# Patient Record
Sex: Male | Born: 1995
Health system: Southern US, Community
[De-identification: ages and names within clinical notes are randomized; demographics above are authoritative.]

## PROBLEM LIST (undated history)

## (undated) DIAGNOSIS — L732 Hidradenitis suppurativa: Secondary | ICD-10-CM

## (undated) DIAGNOSIS — J302 Other seasonal allergic rhinitis: Secondary | ICD-10-CM

## (undated) HISTORY — DX: Hidradenitis suppurativa: L73.2

---

## 2016-06-07 ENCOUNTER — Emergency Department (HOSPITAL_BASED_OUTPATIENT_CLINIC_OR_DEPARTMENT_OTHER): Payer: No Typology Code available for payment source

## 2016-06-07 ENCOUNTER — Emergency Department (HOSPITAL_BASED_OUTPATIENT_CLINIC_OR_DEPARTMENT_OTHER)
Admission: EM | Admit: 2016-06-07 | Discharge: 2016-06-07 | Disposition: A | Discharge status: Home / Self Care | Payer: No Typology Code available for payment source | Attending: Emergency Medicine | Admitting: Emergency Medicine

## 2016-06-07 ENCOUNTER — Encounter (HOSPITAL_BASED_OUTPATIENT_CLINIC_OR_DEPARTMENT_OTHER): Payer: Self-pay | Admitting: Emergency Medicine

## 2016-06-07 DIAGNOSIS — S199XXA Unspecified injury of neck, initial encounter: Secondary | ICD-10-CM | POA: Diagnosis present

## 2016-06-07 DIAGNOSIS — S161XXA Strain of muscle, fascia and tendon at neck level, initial encounter: Secondary | ICD-10-CM | POA: Diagnosis not present

## 2016-06-07 DIAGNOSIS — Y999 Unspecified external cause status: Secondary | ICD-10-CM | POA: Insufficient documentation

## 2016-06-07 DIAGNOSIS — Z79899 Other long term (current) drug therapy: Secondary | ICD-10-CM | POA: Insufficient documentation

## 2016-06-07 DIAGNOSIS — Y9389 Activity, other specified: Secondary | ICD-10-CM | POA: Diagnosis not present

## 2016-06-07 DIAGNOSIS — S39012A Strain of muscle, fascia and tendon of lower back, initial encounter: Secondary | ICD-10-CM

## 2016-06-07 DIAGNOSIS — Y92410 Unspecified street and highway as the place of occurrence of the external cause: Secondary | ICD-10-CM | POA: Diagnosis not present

## 2016-06-07 MED ORDER — NAPROXEN 500 MG PO TABS: 500.0000 mg | ORAL_TABLET | Freq: Two times a day (BID) | ORAL | 0 refills | Status: DC

## 2016-06-07 MED ORDER — IBUPROFEN 800 MG PO TABS
800.0000 mg | ORAL_TABLET | Freq: Once | ORAL | Status: AC
  Administered 2016-06-07: 800 mg via ORAL
  Filled 2016-06-07: qty 1

## 2016-06-07 MED ORDER — CYCLOBENZAPRINE HCL 10 MG PO TABS: 10.0000 mg | ORAL_TABLET | Freq: Two times a day (BID) | ORAL | 0 refills | Status: AC | PRN
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
## 2016-06-07 NOTE — Discharge Instructions (Signed)
Take naprosyn for pain. Flexeril for muscle spasms as needed. Rest. Follow up with your doctor if not improving in 3-5 days. Return if numbness, weakness, severe headache, vomiting, any new concerning symptom.
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
## 2016-11-18 ENCOUNTER — Emergency Department (HOSPITAL_BASED_OUTPATIENT_CLINIC_OR_DEPARTMENT_OTHER)
Admission: EM | Admit: 2016-11-18 | Discharge: 2016-11-18 | Disposition: A | Discharge status: Home / Self Care | Payer: Self-pay | Attending: Emergency Medicine | Admitting: Emergency Medicine

## 2016-11-18 ENCOUNTER — Encounter (HOSPITAL_BASED_OUTPATIENT_CLINIC_OR_DEPARTMENT_OTHER): Payer: Self-pay | Admitting: *Deleted

## 2016-11-18 DIAGNOSIS — R1110 Vomiting, unspecified: Secondary | ICD-10-CM

## 2016-11-18 DIAGNOSIS — R197 Diarrhea, unspecified: Secondary | ICD-10-CM | POA: Insufficient documentation

## 2016-11-18 DIAGNOSIS — R112 Nausea with vomiting, unspecified: Secondary | ICD-10-CM | POA: Insufficient documentation

## 2016-11-18 DIAGNOSIS — Z79899 Other long term (current) drug therapy: Secondary | ICD-10-CM | POA: Insufficient documentation

## 2016-11-18 HISTORY — DX: Other seasonal allergic rhinitis: J30.2

## 2016-11-18 LAB — CBC
HCT: 44.5 % (ref 39.0–52.0)
Hemoglobin: 15.2 g/dL (ref 13.0–17.0)
MCH: 30.8 pg (ref 26.0–34.0)
MCHC: 34.2 g/dL (ref 30.0–36.0)
MCV: 90.3 fL (ref 78.0–100.0)
PLATELETS: 267 10*3/uL (ref 150–400)
RBC: 4.93 MIL/uL (ref 4.22–5.81)
RDW: 12 % (ref 11.5–15.5)
WBC: 11.5 10*3/uL — AB (ref 4.0–10.5)

## 2016-11-18 LAB — COMPREHENSIVE METABOLIC PANEL
ALT: 23 U/L (ref 17–63)
ANION GAP: 8 (ref 5–15)
AST: 24 U/L (ref 15–41)
Albumin: 4.6 g/dL (ref 3.5–5.0)
Alkaline Phosphatase: 49 U/L (ref 38–126)
BUN: 15 mg/dL (ref 6–20)
CHLORIDE: 108 mmol/L (ref 101–111)
CO2: 24 mmol/L (ref 22–32)
Calcium: 9.3 mg/dL (ref 8.9–10.3)
Creatinine, Ser: 0.99 mg/dL (ref 0.61–1.24)
Glucose, Bld: 104 mg/dL — ABNORMAL HIGH (ref 65–99)
POTASSIUM: 3.6 mmol/L (ref 3.5–5.1)
SODIUM: 140 mmol/L (ref 135–145)
Total Bilirubin: 0.6 mg/dL (ref 0.3–1.2)
Total Protein: 8.2 g/dL — ABNORMAL HIGH (ref 6.5–8.1)

## 2016-11-18 LAB — URINALYSIS, ROUTINE W REFLEX MICROSCOPIC
Bilirubin Urine: NEGATIVE
GLUCOSE, UA: NEGATIVE mg/dL
Hgb urine dipstick: NEGATIVE
Ketones, ur: NEGATIVE mg/dL
LEUKOCYTES UA: NEGATIVE
Nitrite: NEGATIVE
PH: 7.5 (ref 5.0–8.0)
PROTEIN: NEGATIVE mg/dL
Specific Gravity, Urine: 1.026 (ref 1.005–1.030)

## 2016-11-18 LAB — LIPASE, BLOOD: LIPASE: 13 U/L (ref 11–51)

## 2016-11-18 MED ORDER — ONDANSETRON HCL 4 MG/2ML IJ SOLN
4.0000 mg | Freq: Once | INTRAMUSCULAR | Status: AC | PRN
  Administered 2016-11-18: 4 mg via INTRAVENOUS
  Filled 2016-11-18: qty 2

## 2016-11-18 MED ORDER — ONDANSETRON 4 MG PO TBDP: 4.0000 mg | ORAL_TABLET | Freq: Three times a day (TID) | ORAL | 0 refills | Status: DC | PRN

## 2016-11-18 MED FILL — ONDANSETRON ODT 4 MG TABLET: 4 | 6 days supply | Qty: 20 | Fill #0

## 2016-11-18 NOTE — ED Notes (Signed)
Pt given gingerale for fluid challenge. 

## 2016-11-18 NOTE — ED Triage Notes (Signed)
Pt reports sudden onset of vomiting and diarrhea at 3am, with abd cramping.

## 2016-11-18 NOTE — ED Provider Notes (Signed)
[facility]   CSN: [id number] Arrival date & time: 11/18/16  0908     History   Chief Complaint Chief Complaint  Patient presents with  . Emesis    HPI Jacob Melendez is a 21 y.o. male.  HPI  Pt presenting with c/o vomtiing and diarrhea.  Symptoms began acutely this morning at 3am.  He had multiple episodes of NB/NB emesis.  Has also developed watery diarrhea.  Has also been having some stomach cramping which is relieved by diarrhea temporarilty.  Pt has not been able to keep down liquids.  No fever/chills.  He does have some sick contacts at his work.  There are no other associated systemic symptoms, there are no other alleviating or modifying factors.   He has not had any treatment prior to arrival.    Past Medical History:  Diagnosis Date  . Seasonal allergies     There are no active problems to display for this patient.   History reviewed. No pertinent surgical history.     Home Medications    Prior to Admission medications   Medication Sig Start Date End Date Taking? Authorizing Provider  cyclobenzaprine (FLEXERIL) 10 MG tablet Take 1 tablet (10 mg total) by mouth 2 (two) times daily as needed for muscle spasms. 06/07/16   Tatyana Kirichenko, PA-C  montelukast (SINGULAIR) 10 MG tablet Take 10 mg by mouth at bedtime.    Historical Provider, MD  naproxen (NAPROSYN) 500 MG tablet Take 1 tablet (500 mg total) by mouth 2 (two) times daily. 06/07/16   Tatyana Kirichenko, PA-C  ondansetron (ZOFRAN ODT) 4 MG disintegrating tablet Take 1 tablet (4 mg total) by mouth every 8 (eight) hours as needed for nausea or vomiting. 11/18/16   Jerelyn Scott, MD    Family History History reviewed. No pertinent family history.  Social History Social History  Substance Use Topics  . Smoking status: Never Smoker  . Smokeless tobacco: Never Used  . Alcohol use No     Allergies   Patient has no known allergies.   Review of Systems Review of Systems  ROS  reviewed and all otherwise negative except for mentioned in HPI   Physical Exam Updated Vital Signs BP 131/69 (BP Location: Left Arm)   Pulse 99   Temp 99.2 F (37.3 C) (Oral)   Resp 20   Ht 6\' 2"  (1.88 m)   Wt 222 lb (100.7 kg)   SpO2 100%   BMI 28.50 kg/m  Vitals reviewed Physical Exam Physical Examination: General appearance - alert, well appearing, and in no distress Mental status - alert, oriented to person, place, and time Eyes -no conjunctival injection, no scleral icterus Mouth - mucous membranes moist, pharynx normal without lesions Chest - clear to auscultation, no wheezes, rales or rhonchi, symmetric air entry Heart - normal rate, regular rhythm, normal S1, S2, no murmurs, rubs, clicks or gallops Abdomen - soft, nontender, nondistended, no masses or organomegaly, nabs Neurological - alert, oriented, normal speech Extremities - peripheral pulses normal, no pedal edema, no clubbing or cyanosis Skin - normal coloration and turgor, no rashes  ED Treatments / Results  Labs (all labs ordered are listed, but only abnormal results are displayed) Labs Reviewed  COMPREHENSIVE METABOLIC PANEL - Abnormal; Notable for the following:       Result Value   Glucose, Bld 104 (*)    Total Protein 8.2 (*)    All other components within normal limits  CBC - Abnormal; Notable for the  following:    WBC 11.5 (*)    All other components within normal limits  LIPASE, BLOOD  URINALYSIS, ROUTINE W REFLEX MICROSCOPIC    EKG  EKG Interpretation None       Radiology No results found.  Procedures Procedures (including critical care time)  Medications Ordered in ED Medications  ondansetron (ZOFRAN) injection 4 mg (4 mg Intravenous Given 11/18/16 0946)     Initial Impression / Assessment and Plan / ED Course  I have reviewed the triage vital signs and the nursing notes.  Pertinent labs & imaging results that were available during my care of the patient were reviewed by me  and considered in my medical decision making (see chart for details).    12:05 PM pt tolerated po fluids in the ED.  He is feeling improved after treatment.   Pt presenting with acute onset of both vomiting and diarrhea.  Abdominal exam is benign.  Pt treated with IV fluids, antiemetics.  Urine is clear.  Suspect gastroenteritis.  Doubt appendicitis, cholecystitis , SBO or other acute emergent process at this time.  Discharged with strict return precautions.  Pt agreeable with plan.  Final Clinical Impressions(s) / ED Diagnoses   Final diagnoses:  Vomiting and diarrhea    New Prescriptions Discharge Medication List as of 11/18/2016 12:06 PM    START taking these medications   Details  ondansetron (ZOFRAN ODT) 4 MG disintegrating tablet Take 1 tablet (4 mg total) by mouth every 8 (eight) hours as needed for nausea or vomiting., Starting Thu 11/18/2016, Print         Jerelyn ScottMartha Linker, MD 11/23/16 1623

## 2016-11-18 NOTE — Discharge Instructions (Signed)
Return to the ED with any concerns including vomiting and not able to keep down liquids or your medications, abdominal pain especially if it localizes to the right lower abdomen, fever or chills, and decreased urine output, decreased level of alertness or lethargy, or any other alarming symptoms.  °
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
## 2020-07-07 ENCOUNTER — Other Ambulatory Visit: Payer: Self-pay | Admitting: Dermatology

## 2020-07-13 ENCOUNTER — Other Ambulatory Visit: Payer: Self-pay | Admitting: Dermatology

## 2020-07-19 ENCOUNTER — Emergency Department (HOSPITAL_BASED_OUTPATIENT_CLINIC_OR_DEPARTMENT_OTHER): Payer: Worker's Compensation

## 2020-07-19 ENCOUNTER — Encounter (HOSPITAL_BASED_OUTPATIENT_CLINIC_OR_DEPARTMENT_OTHER): Payer: Self-pay | Admitting: *Deleted

## 2020-07-19 ENCOUNTER — Emergency Department (HOSPITAL_BASED_OUTPATIENT_CLINIC_OR_DEPARTMENT_OTHER)
Admission: EM | Admit: 2020-07-19 | Discharge: 2020-07-19 | Disposition: A | Discharge status: Home / Self Care | Payer: Worker's Compensation | Attending: Emergency Medicine | Admitting: Emergency Medicine

## 2020-07-19 ENCOUNTER — Other Ambulatory Visit: Payer: Self-pay

## 2020-07-19 DIAGNOSIS — Y9269 Other specified industrial and construction area as the place of occurrence of the external cause: Secondary | ICD-10-CM | POA: Insufficient documentation

## 2020-07-19 DIAGNOSIS — S6991XA Unspecified injury of right wrist, hand and finger(s), initial encounter: Secondary | ICD-10-CM | POA: Diagnosis present

## 2020-07-19 DIAGNOSIS — S60211A Contusion of right wrist, initial encounter: Secondary | ICD-10-CM | POA: Insufficient documentation

## 2020-07-19 DIAGNOSIS — Y990 Civilian activity done for income or pay: Secondary | ICD-10-CM | POA: Insufficient documentation

## 2020-07-19 DIAGNOSIS — W230XXA Caught, crushed, jammed, or pinched between moving objects, initial encounter: Secondary | ICD-10-CM | POA: Insufficient documentation

## 2020-07-19 NOTE — ED Notes (Signed)
Pt still not in room. Pt never formally discharged.

## 2020-07-19 NOTE — ED Provider Notes (Signed)
[facility] EMERGENCY DEPARTMENT Provider Note   CSN: [id number] Arrival date & time: 07/19/20  1726     History Chief Complaint  Patient presents with  . Arm Injury    Jacob Melendez is a 24 y.o. male.  HPI     24 year old male presents with concern for right wrist pain after have been caught between milk crates and a machine at work.  Reports that there was essential crushing mechanism for about 30 seconds as his coworkers and stacked the crates and try to stop the machine to get his arm free.  Reports pain approximately a 4 out of 10 in his right forearm/wrist.  Denies numbness, weakness.  Pain is worse with movement and palpation.  No other injuries.  Past Medical History:  Diagnosis Date  . Seasonal allergies     There are no problems to display for this patient.   History reviewed. No pertinent surgical history.     No family history on file.  Social History   Tobacco Use  . Smoking status: Never Smoker  . Smokeless tobacco: Never Used  Substance Use Topics  . Alcohol use: No  . Drug use: No    Home Medications Prior to Admission medications   Medication Sig Start Date End Date Taking? Authorizing Provider  amLODipine (NORVASC) 5 MG tablet Take 5 mg by mouth daily. 06/29/20  Yes [provider]  hydrochlorothiazide (HYDRODIURIL) 25 MG tablet Take 25 mg by mouth daily. 06/29/20  Yes [provider]  montelukast (SINGULAIR) 10 MG tablet Take 10 mg by mouth at bedtime.   Yes [provider]  cyclobenzaprine (FLEXERIL) 10 MG tablet Take 1 tablet (10 mg total) by mouth 2 (two) times daily as needed for muscle spasms. 06/07/16   Kirichenko, Tatyana, PA-C  naproxen (NAPROSYN) 500 MG tablet Take 1 tablet (500 mg total) by mouth 2 (two) times daily. 06/07/16   Kirichenko, Tatyana, PA-C  ondansetron (ZOFRAN ODT) 4 MG disintegrating tablet Take 1 tablet (4 mg total) by mouth every 8 (eight) hours as needed for nausea or vomiting. 11/18/16    Mabe, Latanya Maudlin, MD    Allergies    Patient has no known allergies.  Review of Systems   Review of Systems  Constitutional: Negative for fever.  Respiratory: Negative for shortness of breath.   Cardiovascular: Negative for palpitations.  Musculoskeletal: Positive for arthralgias.  Skin: Negative for rash and wound.  Neurological: Negative for numbness.    Physical Exam Updated Vital Signs BP (!) 125/98 (BP Location: Left Arm)   Pulse 84   Temp 98.2 F (36.8 C) (Oral)   Resp 16   Ht 6\' 2"  (1.88 m)   Wt 110 kg   SpO2 99%   BMI 31.12 kg/m   Physical Exam Vitals and nursing note reviewed.  Constitutional:      General: He is not in acute distress.    Appearance: Normal appearance. He is not ill-appearing, toxic-appearing or diaphoretic.  HENT:     Head: Normocephalic.  Eyes:     Conjunctiva/sclera: Conjunctivae normal.  Cardiovascular:     Rate and Rhythm: Normal rate and regular rhythm.     Pulses: Normal pulses.  Pulmonary:     Effort: Pulmonary effort is normal. No respiratory distress.  Musculoskeletal:        General: Swelling and tenderness (right wrist) present. No deformity or signs of injury.     Cervical back: No rigidity.     Comments: Normal ROM, normal pulses  and cap refill, normal strength/sensation opponens/abduction  Skin:    General: Skin is warm and dry.     Coloration: Skin is not jaundiced or pale.  Neurological:     General: No focal deficit present.     Mental Status: He is alert and oriented to person, place, and time.     ED Results / Procedures / Treatments   Labs (all labs ordered are listed, but only abnormal results are displayed) Labs Reviewed - No data to display  EKG None  Radiology DG Wrist Complete Right  Result Date: 07/19/2020 CLINICAL DATA:  Pain EXAM: RIGHT WRIST - COMPLETE 3+ VIEW COMPARISON:  None. FINDINGS: There is soft tissue swelling about the wrist without evidence for an acute displaced fracture or dislocation.  There are no significant degenerative changes. IMPRESSION: Soft tissue swelling without evidence for an acute displaced fracture or dislocation. Electronically Signed   By: Katherine Mantle M.D.   On: 07/19/2020 18:43    Procedures Procedures (including critical care time)  Medications Ordered in ED Medications - No data to display  ED Course  I have reviewed the triage vital signs and the nursing notes.  Pertinent labs & imaging results that were available during my care of the patient were reviewed by me and considered in my medical decision making (see chart for details).    MDM Rules/Calculators/A&P                           24 year old male presents with concern for right wrist pain after have been caught between milk crates and a machine at work.   X-ray shows no evidence of fracture or dislocation.  He is neurologically intact.  No sign of compartment syndrome, and normal blood flow/cap refill.     Suspect contusion> Discussed reasons to return.  Rec rest, ibuprofen/tylenol/ice. Patient discharged in stable condition with understanding of reasons to return.   Final Clinical Impression(s) / ED Diagnoses Final diagnoses:  Injury of right wrist, initial encounter  Contusion of right wrist, initial encounter    Rx / DC Orders ED Discharge Orders    None       Alvira Monday, MD 07/20/20 2152

## 2020-07-19 NOTE — ED Triage Notes (Signed)
Pt reports his right arm was caught between milk crates and a machine at work . C/o pain in right forearm. Swelling noted

## 2020-07-19 NOTE — ED Notes (Signed)
Pt not on stretcher

## 2020-07-23 ENCOUNTER — Other Ambulatory Visit: Payer: Self-pay | Admitting: Dermatology

## 2020-10-02 ENCOUNTER — Other Ambulatory Visit: Payer: Self-pay | Admitting: Dermatology

## 2020-11-19 ENCOUNTER — Ambulatory Visit (INDEPENDENT_AMBULATORY_CARE_PROVIDER_SITE_OTHER): Payer: 59 | Admitting: Dermatology

## 2020-11-19 ENCOUNTER — Other Ambulatory Visit: Payer: Self-pay

## 2020-11-19 ENCOUNTER — Encounter: Payer: Self-pay | Admitting: Dermatology

## 2020-11-19 DIAGNOSIS — L732 Hidradenitis suppurativa: Secondary | ICD-10-CM | POA: Diagnosis not present

## 2020-11-19 MED ORDER — RIFAMPIN 300 MG PO CAPS: 300.0000 mg | ORAL_CAPSULE | Freq: Every day | ORAL | 6 refills | Status: DC

## 2020-11-19 MED ORDER — CLINDAMYCIN HCL 300 MG PO CAPS: 300.0000 mg | ORAL_CAPSULE | Freq: Every day | ORAL | 6 refills | Status: DC

## 2020-11-23 ENCOUNTER — Encounter: Payer: Self-pay | Admitting: Dermatology

## 2020-11-23 NOTE — Progress Notes (Signed)
   Follow-Up Visit   Subjective  Jacob Melendez is a 25 y.o. male who presents for the following: Follow-up (H.S.-  clear tx- rifampin & clindamycin).  Hidradenitis/dissecting cellulitis of the scalp. Location:  Duration:  Quality: Dramatically improved Associated Signs/Symptoms: Modifying Factors:  Severity:  Timing: Context:   Objective  Well appearing patient in no apparent distress; mood and affect are within normal limits. Objective  Mid Occipital Scalp: Historical involvement of scalp more than axillae.  Since treatment he has had no significant flare with rare small inflammatory lesions that spontaneously clear indays.    A focused examination was performed including Head and neck.. Relevant physical exam findings are noted in the Assessment and Plan.   Assessment & Plan    Hidradenitis suppurativa Mid Occipital Scalp  He completed roughly 10 weeks of combination clindamycin plus rifampicin with apparent excellent response.  I explained to him that this is not meant to be a long-term strategy but for now he does not need to be started on Humira.  I am okay with 1 week intervention for moderate flare.  Initial follow-up by phone in 2 months.  Ordered Medications: clindamycin (CLEOCIN) 300 MG capsule rifampin (RIFADIN) 300 MG capsule      I, Janalyn Harder, MD, have reviewed all documentation for this visit.  The documentation on 11/23/20 for the exam, diagnosis, procedures, and orders are all accurate and complete.

## 2020-12-17 ENCOUNTER — Ambulatory Visit: Payer: 59 | Admitting: Dermatology

## 2021-07-15 ENCOUNTER — Other Ambulatory Visit: Payer: Self-pay | Admitting: Dermatology

## 2021-07-15 DIAGNOSIS — L732 Hidradenitis suppurativa: Secondary | ICD-10-CM

## 2021-11-23 ENCOUNTER — Ambulatory Visit (INDEPENDENT_AMBULATORY_CARE_PROVIDER_SITE_OTHER): Payer: Managed Care, Other (non HMO) | Admitting: Dermatology

## 2021-11-23 ENCOUNTER — Encounter: Payer: Self-pay | Admitting: Dermatology

## 2021-11-23 ENCOUNTER — Other Ambulatory Visit: Payer: Self-pay

## 2021-11-23 DIAGNOSIS — L732 Hidradenitis suppurativa: Secondary | ICD-10-CM | POA: Diagnosis not present

## 2021-11-23 MED ORDER — CLINDAMYCIN HCL 300 MG PO CAPS: 300.0000 mg | ORAL_CAPSULE | Freq: Every day | ORAL | 3 refills | Status: DC

## 2021-11-23 MED ORDER — RIFAMPIN 300 MG PO CAPS: 300.0000 mg | ORAL_CAPSULE | Freq: Every day | ORAL | 3 refills | Status: AC

## 2021-12-13 NOTE — Progress Notes (Signed)
° °  Follow-Up Visit   Subjective  Jacob Melendez is a 26 y.o. male who presents for the following: Follow-up (Pt here to f/u on HS. Pt states that he is doing good and the medication is working. Pt states that when he flares it is in the nape of the neck and b/l axillae).  Hidradenitis axilla and neck Location:  Duration:  Quality:  Associated Signs/Symptoms: Modifying Factors:  Severity:  Timing: Context:   Objective  Well appearing patient in no apparent distress; mood and affect are within normal limits. Mid Back No active inflamed cysts today.  We had an extended discussion about treatment options for at bedtime including everything from simple I&D, unroofing of lesions, "clean out" surgery with the axillary, long-term daily antibiotics, starting antibiotics, Humira, and upcoming Biologics.    All skin waist up examined.   Assessment & Plan    Hidradenitis suppurativa Mid Back  Would like to continue to use antibiotics on a as needed flare basis and I think this is reasonable.  He understands the risks of developing an allergy, severe diarrhea, GI issues.  He will contact me if the antibiotics are no longer helping otherwise follow-up 1 year.  clindamycin (CLEOCIN) 300 MG capsule - Mid Back Take 1 capsule (300 mg total) by mouth daily.  rifampin (RIFADIN) 300 MG capsule - Mid Back Take 1 capsule (300 mg total) by mouth daily.      I, Lavonna Monarch, MD, have reviewed all documentation for this visit.  The documentation on 12/13/21 for the exam, diagnosis, procedures, and orders are all accurate and complete.

## 2023-10-09 ENCOUNTER — Ambulatory Visit (HOSPITAL_COMMUNITY)
Admission: EM | Admit: 2023-10-09 | Discharge: 2023-10-09 | Disposition: A | Discharge status: Home / Self Care | Payer: Managed Care, Other (non HMO) | Attending: Family Medicine | Admitting: Family Medicine

## 2023-10-09 ENCOUNTER — Encounter (HOSPITAL_COMMUNITY): Payer: Self-pay

## 2023-10-09 ENCOUNTER — Ambulatory Visit (INDEPENDENT_AMBULATORY_CARE_PROVIDER_SITE_OTHER): Payer: Managed Care, Other (non HMO)

## 2023-10-09 DIAGNOSIS — M79672 Pain in left foot: Secondary | ICD-10-CM

## 2023-10-09 LAB — CBC
HCT: 46.8 % (ref 39.0–52.0)
Hemoglobin: 15.9 g/dL (ref 13.0–17.0)
MCH: 29.8 pg (ref 26.0–34.0)
MCHC: 34 g/dL (ref 30.0–36.0)
MCV: 87.8 fL (ref 80.0–100.0)
Platelets: 363 10*3/uL (ref 150–400)
RBC: 5.33 MIL/uL (ref 4.22–5.81)
RDW: 12.6 % (ref 11.5–15.5)
WBC: 10.9 10*3/uL — ABNORMAL HIGH (ref 4.0–10.5)
nRBC: 0 % (ref 0.0–0.2)

## 2023-10-09 LAB — BASIC METABOLIC PANEL
Anion gap: 9 (ref 5–15)
BUN: 14 mg/dL (ref 6–20)
CO2: 24 mmol/L (ref 22–32)
Calcium: 9.6 mg/dL (ref 8.9–10.3)
Chloride: 107 mmol/L (ref 98–111)
Creatinine, Ser: 1.06 mg/dL (ref 0.61–1.24)
GFR, Estimated: >60 mL/min (ref >60)
Glucose, Bld: 111 mg/dL — ABNORMAL HIGH (ref 70–99)
Potassium: 3.6 mmol/L (ref 3.5–5.1)
Sodium: 140 mmol/L (ref 135–145)

## 2023-10-09 LAB — URIC ACID: Uric Acid, Serum: 11.2 mg/dL — ABNORMAL HIGH (ref 3.7–8.6)

## 2023-10-09 MED ORDER — KETOROLAC TROMETHAMINE 30 MG/ML IJ SOLN: 30.0000 mg | Freq: Once | INTRAMUSCULAR | Status: DC

## 2023-10-09 MED ORDER — KETOROLAC TROMETHAMINE 10 MG PO TABS: 10.0000 mg | ORAL_TABLET | Freq: Four times a day (QID) | ORAL | 0 refills | Status: AC | PRN

## 2023-10-09 MED ORDER — KETOROLAC TROMETHAMINE 30 MG/ML IJ SOLN
INTRAMUSCULAR | Status: AC
  Filled 2023-10-09: qty 1

## 2023-10-09 NOTE — ED Triage Notes (Signed)
Denies any recent injury or falls.

## 2023-10-09 NOTE — ED Triage Notes (Signed)
Pt presents to the office left foot pain that started today. Pt states he does not have full range of motion.

## 2023-10-09 NOTE — ED Provider Notes (Signed)
[facility]    CSN: [id number] Arrival date & time: 10/09/23  1647      History   Chief Complaint Chief Complaint  Patient presents with   Foot Pain    HPI Jacob Melendez is a 27 y.o. male.    Foot Pain  Here for pain in his left foot that is on the medial aspect anterior to the ankle.  It began bothering him a few days ago. It's been like a cramp a lot of the time  This morning it felt almost numb.  It will feel better if he walks around.  No back pain and no upper leg pain.  No fall or trauma.  No fever or chills  He does take medication for blood pressure    Past Medical History:  Diagnosis Date   Hidradenitis suppurativa    Seasonal allergies     There are no active problems to display for this patient.   History reviewed. No pertinent surgical history.     Home Medications    Prior to Admission medications   Medication Sig Start Date End Date Taking? Authorizing Provider  amLODipine (NORVASC) 5 MG tablet Take 1 tablet by mouth daily. 09/16/21  Yes [provider]  hydrochlorothiazide (HYDRODIURIL) 25 MG tablet Take 25 mg by mouth daily. 06/29/20  Yes [provider]  ketorolac (TORADOL) 10 MG tablet Take 1 tablet (10 mg total) by mouth every 6 (six) hours as needed (pain). 10/09/23  Yes Zenia Resides, MD  montelukast (SINGULAIR) 10 MG tablet Take 10 mg by mouth at bedtime.   Yes [provider]  cyclobenzaprine (FLEXERIL) 10 MG tablet Take 1 tablet (10 mg total) by mouth 2 (two) times daily as needed for muscle spasms. 06/07/16   Kirichenko, Tatyana, PA-C  rifampin (RIFADIN) 300 MG capsule Take 1 capsule (300 mg total) by mouth daily. 11/23/21   Janalyn Harder, MD  Selenium Sulfide 2.25 % SHAM Apply topically. 05/28/21   [provider]    Family History History reviewed. No pertinent family history.  Social History Social History   Tobacco Use   Smoking status: Never   Smokeless tobacco: Never   Substance Use Topics   Alcohol use: No   Drug use: No     Allergies   Patient has no known allergies.   Review of Systems Review of Systems   Physical Exam Triage Vital Signs ED Triage Vitals  Encounter Vitals Group     BP 10/09/23 1742 131/87     Systolic BP Percentile --      Diastolic BP Percentile --      Pulse Rate 10/09/23 1742 (!) 103     Resp 10/09/23 1742 16     Temp 10/09/23 1742 98.4 F (36.9 C)     Temp Source 10/09/23 1742 Oral     SpO2 10/09/23 1742 94 %     Weight --      Height --      Head Circumference --      Peak Flow --      Pain Score 10/09/23 1747 5     Pain Loc --      Pain Education --      Exclude from Growth Chart --    No data found.  Updated Vital Signs BP 131/87 (BP Location: Left Arm)   Pulse (!) 103   Temp 98.4 F (36.9 C) (Oral)   Resp 16   SpO2 94%   Visual Acuity Right  Eye Distance:   Left Eye Distance:   Bilateral Distance:    Right Eye Near:   Left Eye Near:    Bilateral Near:     Physical Exam Constitutional:      General: He is not in acute distress.    Appearance: He is not toxic-appearing.  Musculoskeletal:     Comments: He indicates his left foot medially anterior to the left lateral malleolus has a spot that is hurting.  Is not tender and there is no erythema or deformity.  Skin:    Coloration: Skin is not jaundiced or pale.  Neurological:     Mental Status: He is alert and oriented to person, place, and time.  Psychiatric:        Behavior: Behavior normal.      UC Treatments / Results  Labs (all labs ordered are listed, but only abnormal results are displayed) Labs Reviewed  BASIC METABOLIC PANEL  CBC  URIC ACID    EKG   Radiology No results found.  Procedures Procedures (including critical care time)  Medications Ordered in UC Medications  ketorolac (TORADOL) 30 MG/ML injection 30 mg (has no administration in time range)    Initial Impression / Assessment and Plan / UC Course   I have reviewed the triage vital signs and the nursing notes.  Pertinent labs & imaging results that were available during my care of the patient were reviewed by me and considered in my medical decision making (see chart for details).     CBC, BMP, and uric acid are drawn to further evaluate this pain Foot x-ray by my review is normal.  He is advised of radiology overread.  Toradol injection is given here and Toradol tablets are sent to the pharmacy for pain relief.  Ace wrap is applied in case it will help.  He will follow-up with his primary care and I have given him contact information for podiatry Final Clinical Impressions(s) / UC Diagnoses   Final diagnoses:  Foot pain, left     Discharge Instructions      By my review, your foot x-ray is normal.  The radiologist will also read your x-ray, and if their interpretation differs significantly from mine, we will call you.  You have been given a shot of Toradol 30 mg today.  Ketorolac 10 mg tablets--take 1 tablet every 6 hours as needed for pain.  This is the same medicine that is in the shot we just gave you  Please follow-up with your primary care about this issue.  You could also call the foot doctors for their evaluation     ED Prescriptions     Medication Sig Dispense Auth. Provider   ketorolac (TORADOL) 10 MG tablet Take 1 tablet (10 mg total) by mouth every 6 (six) hours as needed (pain). 20 tablet Louanna Vanliew, Janace Aris, MD      PDMP not reviewed this encounter.   Zenia Resides, MD 10/09/23 [phone]

## 2023-10-09 NOTE — Discharge Instructions (Signed)
By my review, your foot x-ray is normal.  The radiologist will also read your x-ray, and if their interpretation differs significantly from mine, we will call you.  You have been given a shot of Toradol 30 mg today.  Ketorolac 10 mg tablets--take 1 tablet every 6 hours as needed for pain.  This is the same medicine that is in the shot we just gave you  Please follow-up with your primary care about this issue.  You could also call the foot doctors for their evaluation

## 2023-10-14 ENCOUNTER — Ambulatory Visit (INDEPENDENT_AMBULATORY_CARE_PROVIDER_SITE_OTHER): Payer: Managed Care, Other (non HMO) | Admitting: Podiatry

## 2023-10-14 DIAGNOSIS — Z91199 Patient's noncompliance with other medical treatment and regimen due to unspecified reason: Secondary | ICD-10-CM

## 2023-10-14 NOTE — Progress Notes (Signed)
Patient absent from appointment
